# Patient Record
Sex: Male | Born: 1965 | Race: White | Hispanic: No | Marital: Married | Smoking: Never smoker
Health system: Southern US, Community
[De-identification: ages and names within clinical notes are randomized; demographics above are authoritative.]

## PROBLEM LIST (undated history)

## (undated) DIAGNOSIS — E119 Type 2 diabetes mellitus without complications: Secondary | ICD-10-CM

## (undated) HISTORY — PX: FOOT SURGERY: SHX648

---

## 1999-10-23 ENCOUNTER — Encounter: Payer: Self-pay | Admitting: Emergency Medicine

## 1999-10-23 ENCOUNTER — Emergency Department (HOSPITAL_COMMUNITY): Admission: EM | Admit: 1999-10-23 | Discharge: 1999-10-23 | Payer: Self-pay | Admitting: Emergency Medicine

## 1999-10-30 ENCOUNTER — Emergency Department (HOSPITAL_COMMUNITY): Admission: EM | Admit: 1999-10-30 | Discharge: 1999-10-30 | Payer: Self-pay | Admitting: Emergency Medicine

## 1999-12-02 ENCOUNTER — Encounter: Admission: RE | Admit: 1999-12-02 | Discharge: 2000-03-01 | Payer: Self-pay | Admitting: Internal Medicine

## 2018-12-13 ENCOUNTER — Emergency Department (HOSPITAL_BASED_OUTPATIENT_CLINIC_OR_DEPARTMENT_OTHER)
Admission: EM | Admit: 2018-12-13 | Discharge: 2018-12-13 | Disposition: A | Discharge status: Home / Self Care | Attending: Emergency Medicine | Admitting: Emergency Medicine

## 2018-12-13 ENCOUNTER — Encounter (HOSPITAL_BASED_OUTPATIENT_CLINIC_OR_DEPARTMENT_OTHER): Payer: Self-pay

## 2018-12-13 ENCOUNTER — Other Ambulatory Visit: Payer: Self-pay

## 2018-12-13 ENCOUNTER — Emergency Department (HOSPITAL_BASED_OUTPATIENT_CLINIC_OR_DEPARTMENT_OTHER)

## 2018-12-13 DIAGNOSIS — M549 Dorsalgia, unspecified: Secondary | ICD-10-CM

## 2018-12-13 DIAGNOSIS — Y9289 Other specified places as the place of occurrence of the external cause: Secondary | ICD-10-CM | POA: Diagnosis not present

## 2018-12-13 DIAGNOSIS — S299XXA Unspecified injury of thorax, initial encounter: Secondary | ICD-10-CM | POA: Diagnosis not present

## 2018-12-13 DIAGNOSIS — W01198A Fall on same level from slipping, tripping and stumbling with subsequent striking against other object, initial encounter: Secondary | ICD-10-CM | POA: Insufficient documentation

## 2018-12-13 DIAGNOSIS — S4992XA Unspecified injury of left shoulder and upper arm, initial encounter: Secondary | ICD-10-CM | POA: Insufficient documentation

## 2018-12-13 DIAGNOSIS — W19XXXA Unspecified fall, initial encounter: Secondary | ICD-10-CM

## 2018-12-13 DIAGNOSIS — Y9301 Activity, walking, marching and hiking: Secondary | ICD-10-CM | POA: Diagnosis not present

## 2018-12-13 DIAGNOSIS — Y998 Other external cause status: Secondary | ICD-10-CM | POA: Insufficient documentation

## 2018-12-13 DIAGNOSIS — M546 Pain in thoracic spine: Secondary | ICD-10-CM

## 2018-12-13 MED ORDER — ACETAMINOPHEN 500 MG PO TABS: 500.0000 mg | ORAL_TABLET | Freq: Four times a day (QID) | ORAL | 0 refills | Status: AC | PRN

## 2018-12-13 MED ORDER — NAPROXEN 500 MG PO TABS: 500.0000 mg | ORAL_TABLET | Freq: Two times a day (BID) | ORAL | 0 refills | Status: AC

## 2018-12-13 MED ORDER — KETOROLAC TROMETHAMINE 30 MG/ML IJ SOLN
30.0000 mg | Freq: Once | INTRAMUSCULAR | Status: AC
  Administered 2018-12-13: 30 mg via INTRAMUSCULAR
  Filled 2018-12-13: qty 1

## 2018-12-13 MED ORDER — METHOCARBAMOL 500 MG PO TABS: 500.0000 mg | ORAL_TABLET | Freq: Two times a day (BID) | ORAL | 0 refills | Status: AC

## 2018-12-13 NOTE — Discharge Instructions (Signed)
Take Naprosyn twice daily for your pain and inflammation.  Take Tylenol as prescribed throughout the day as needed.  Take Robaxin twice daily as needed for muscle pain and spasms.  Do not drive or operate machinery while taking this medication.  Use ice and heat alternating 20 minutes on, 20 minutes off.  Attempt the stretches below as tolerated daily.  Avoid heavy lifting, but try to keep up with your normal daily activity.  Do not sit or lay down too much as this can cause your muscles to tighten up more.  Please return to the emergency department you develop any new or worsening symptoms.

## 2018-12-13 NOTE — ED Provider Notes (Signed)
[facility] EMERGENCY DEPARTMENT Provider Note   CSN: [id number] Arrival date & time: 12/13/18  1616     History   Chief Complaint No chief complaint on file.   HPI Allen Ortega is a 52 y.o. male who is previously healthy presents with left shoulder and left-sided back pain after fall.  Patient reports he was working at the [employer] and carrying a casket when he tripped and fell.  He hit the left side of his upper back and low back on the CAT scan and his low back on the floor.  He did not hit his head or lose consciousness.  He essentially tripped.  He did not feel dizzy or lightheaded prior to the fall.  He denies any numbness or tingling in his legs, loss of bowel or bladder control.  He was ambulatory.  He did not take any medications prior to arrival.  HPI  History reviewed. No pertinent past medical history.  There are no active problems to display for this patient.   History reviewed. No pertinent surgical history.      Home Medications    Prior to Admission medications   Medication Sig Start Date End Date Taking? Authorizing Provider  acetaminophen (TYLENOL) 500 MG tablet Take 1 tablet (500 mg total) by mouth every 6 (six) hours as needed. 12/13/18   Jaspal Pultz, Waylan Boga, PA-C  methocarbamol (ROBAXIN) 500 MG tablet Take 1 tablet (500 mg total) by mouth 2 (two) times daily. 12/13/18   Angelo Caroll, Waylan Boga, PA-C  naproxen (NAPROSYN) 500 MG tablet Take 1 tablet (500 mg total) by mouth 2 (two) times daily. 12/13/18   Emi Holes, PA-C    Family History No family history on file.  Social History Social History   Tobacco Use  . Smoking status: Never Smoker  . Smokeless tobacco: Never Used  Substance Use Topics  . Alcohol use: Never    Frequency: Never  . Drug use: Never     Allergies   Patient has no known allergies.   Review of Systems Review of Systems  Musculoskeletal: Positive for arthralgias and back pain.  Neurological: Negative for  syncope and numbness.     Physical Exam Updated Vital Signs BP 119/84 (BP Location: Left Arm)   Pulse 86   Temp 98.4 F (36.9 C) (Oral)   Resp 20   Ht 6\' 2"  (1.88 m)   Wt (!) 136.7 kg   SpO2 98%   BMI 38.69 kg/m   Physical Exam Vitals signs and nursing note reviewed.  Constitutional:      General: He is not in acute distress.    Appearance: He is well-developed. He is not diaphoretic.  HENT:     Head: Normocephalic and atraumatic.     Mouth/Throat:     Pharynx: No oropharyngeal exudate.  Eyes:     General: No scleral icterus.       Right eye: No discharge.        Left eye: No discharge.     Conjunctiva/sclera: Conjunctivae normal.     Pupils: Pupils are equal, round, and reactive to light.  Neck:     Musculoskeletal: Normal range of motion and neck supple.     Thyroid: No thyromegaly.  Cardiovascular:     Rate and Rhythm: Normal rate and regular rhythm.     Heart sounds: Normal heart sounds. No murmur. No friction rub. No gallop.   Pulmonary:     Effort: Pulmonary effort is normal. No respiratory  distress.     Breath sounds: Normal breath sounds. No stridor. No wheezing or rales.  Abdominal:     General: Bowel sounds are normal. There is no distension.     Palpations: Abdomen is soft.     Tenderness: There is no abdominal tenderness. There is no guarding or rebound.  Musculoskeletal:     Comments: Tenderness over the left shoulder and scapula Tenderness on the left thoracic and lumbar region and in the left gluteal region No midline cervical, thoracic, or lumbar tenderness  No bony tenderness over the hips  Lymphadenopathy:     Cervical: No cervical adenopathy.  Skin:    General: Skin is warm and dry.     Coloration: Skin is not pale.     Findings: No rash.  Neurological:     Mental Status: He is alert.     Coordination: Coordination normal.     Comments: 5/5 strength and normal sensation to bilateral lower extremities      ED Treatments / Results    Labs (all labs ordered are listed, but only abnormal results are displayed) Labs Reviewed - No data to display  EKG None  Radiology Dg Thoracic Spine W/swimmers  Result Date: 12/13/2018 CLINICAL DATA:  Fall at work today.  Upper back pain. EXAM: THORACIC SPINE - 3 VIEWS COMPARISON:  None. FINDINGS: The alignment is normal. There is no evidence of acute fracture, paraspinal hematoma or widening of the interpedicular distance. There are flowing osteophytes throughout the thoracic spine. IMPRESSION: No acute osseous findings. Probable diffuse idiopathic skeletal hyperostosis. Electronically Signed   By: Carey BullocksWilliam  Veazey M.D.   On: 12/13/2018 17:58   Dg Lumbar Spine Complete  Result Date: 12/13/2018 CLINICAL DATA:  Left-sided low back pain after falling at work today. EXAM: LUMBAR SPINE - COMPLETE 4+ VIEW COMPARISON:  Abdominopelvic CT 01/24/2018. FINDINGS: Bone detail is limited by motion on the AP view. There are 5 lumbar type vertebral bodies. The alignment is normal. No evidence of acute fracture or pars defect. There is moderate lower lumbar spondylosis with paraspinal osteophytes and facet hypertrophy. The sacroiliac joints do not appear ankylosed. IMPRESSION: No acute osseous findings.  Moderate lumbar spondylosis. Electronically Signed   By: Carey BullocksWilliam  Veazey M.D.   On: 12/13/2018 18:00   Dg Shoulder Left  Result Date: 12/13/2018 CLINICAL DATA:  Fall at work today.  Left shoulder injury. EXAM: LEFT SHOULDER - 2+ VIEW COMPARISON:  None. FINDINGS: The mineralization and alignment are normal. There is no evidence of acute fracture or dislocation. The subacromial space is preserved. There are mild acromioclavicular degenerative changes. IMPRESSION: No evidence of acute fracture or dislocation. Electronically Signed   By: Carey BullocksWilliam  Veazey M.D.   On: 12/13/2018 17:56    Procedures Procedures (including critical care time)  Medications Ordered in ED Medications  ketorolac (TORADOL) 30 MG/ML  injection 30 mg (30 mg Intramuscular Given 12/13/18 1719)     Initial Impression / Assessment and Plan / ED Course  I have reviewed the triage vital signs and the nursing notes.  Pertinent labs & imaging results that were available during my care of the patient were reviewed by me and considered in my medical decision making (see chart for details).     Patient presenting with left shoulder and back pain after fall.  He is neurovascularly intact.  X-ray of the left shoulder, thoracic, and lumbar spine are negative for acute findings.  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  Patient made aware of these  chronic findings.  Patient is feeling much better after Toradol.  Will discharge home with ibuprofen, Tylenol, Robaxin.  Ice and heat recommended.  Follow-up to PCP as needed.  Return precautions discussed.  Patient understands and agrees with plan.  Patient vital stable throughout ED course and discharged in satisfactory condition.  Final Clinical Impressions(s) / ED Diagnoses   Final diagnoses:  Thoracic back pain  Fall, initial encounter  Acute left-sided back pain, unspecified back location    ED Discharge Orders         Ordered    naproxen (NAPROSYN) 500 MG tablet  2 times daily     12/13/18 1832    acetaminophen (TYLENOL) 500 MG tablet  Every 6 hours PRN     12/13/18 1832    methocarbamol (ROBAXIN) 500 MG tablet  2 times daily     12/13/18 [address]1832           Ardith Test M, Cordelia Poche-C 12/14/18 2012    Gwyneth SproutPlunkett, Whitney, MD 12/15/18 0100

## 2018-12-13 NOTE — ED Triage Notes (Signed)
Pt states he fell at work ~3pm-pain to upper back, neck and lower back-NAD-slow gait

## 2018-12-13 NOTE — ED Notes (Signed)
NAD at this time. Pt is stable and going home.  

## 2021-10-11 ENCOUNTER — Emergency Department (HOSPITAL_BASED_OUTPATIENT_CLINIC_OR_DEPARTMENT_OTHER)
Admission: EM | Admit: 2021-10-11 | Discharge: 2021-10-11 | Disposition: A | Discharge status: Home / Self Care | Payer: BLUE CROSS/BLUE SHIELD | Attending: Emergency Medicine | Admitting: Emergency Medicine

## 2021-10-11 ENCOUNTER — Encounter (HOSPITAL_BASED_OUTPATIENT_CLINIC_OR_DEPARTMENT_OTHER): Payer: Self-pay | Admitting: Urology

## 2021-10-11 ENCOUNTER — Emergency Department (HOSPITAL_BASED_OUTPATIENT_CLINIC_OR_DEPARTMENT_OTHER): Payer: BLUE CROSS/BLUE SHIELD

## 2021-10-11 DIAGNOSIS — M25562 Pain in left knee: Secondary | ICD-10-CM | POA: Insufficient documentation

## 2021-10-11 MED ORDER — HYDROCODONE-ACETAMINOPHEN 5-325 MG PO TABS
1.0000 | ORAL_TABLET | Freq: Once | ORAL | Status: AC
  Administered 2021-10-11: 1 via ORAL
  Filled 2021-10-11: qty 1

## 2021-10-11 MED ORDER — IBUPROFEN 600 MG PO TABS: 600.0000 mg | ORAL_TABLET | Freq: Four times a day (QID) | ORAL | 0 refills | Status: AC | PRN

## 2021-10-11 MED ORDER — HYDROCODONE-ACETAMINOPHEN 5-325 MG PO TABS: 1.0000 | ORAL_TABLET | ORAL | 0 refills | Status: AC | PRN

## 2021-10-11 NOTE — ED Triage Notes (Signed)
Pt states left knee pain x 2 days with bending, able to bear weight.  States fell x 8 weeks ago on concrete on the same knee.

## 2021-10-11 NOTE — ED Provider Notes (Signed)
[facility] EMERGENCY DEPARTMENT Provider Note   CSN: [id number] Arrival date & time: 10/11/21  1110     History Chief Complaint  Patient presents with   Knee Pain    Allen Ortega is a 55 y.o. male.  This is a 55 y.o. male without significant medical history who presents to the ED with complaint of left knee pain.   Location:  left knee Duration:  8 wks, worse in last 5 days Onset:  gradual Timing:  intermittent Description:  sharp/aching pain, more on medial aspect to knee Severity:  mild Exacerbating/Alleviating Factors:  worse with palpation, ambulation Associated Symptoms:  none reported Pertinent Negatives:  fevers, chills, IVDU, hip/back pain, numbness or tingling Context: fall around 8 wks ago onto the left knee. Intermittent pain since then, takes motrin periodically with improvement to pain. Pain worsened in last 4-5 days.    The history is provided by the patient. No language interpreter was used.  Knee Pain Location:  Knee Knee location:  L knee Pain details:    Quality:  Aching Associated symptoms: no fever       History reviewed. No pertinent past medical history.  There are no problems to display for this patient.   Past Surgical History:  Procedure Laterality Date   FOOT SURGERY Left        History reviewed. No pertinent family history.  Social History   Tobacco Use   Smoking status: Never   Smokeless tobacco: Never  Vaping Use   Vaping Use: Never used  Substance Use Topics   Alcohol use: Never   Drug use: Never    Home Medications Prior to Admission medications   Medication Sig Start Date End Date Taking? Authorizing Provider  HYDROcodone-acetaminophen (NORCO/VICODIN) 5-325 MG tablet Take 1 tablet by mouth every 4 (four) hours as needed. 10/11/21  Yes Tanda Rockers A, DO  ibuprofen (ADVIL) 600 MG tablet Take 1 tablet (600 mg total) by mouth every 6 (six) hours as needed. 10/11/21  Yes Sloan Leiter, DO  acetaminophen  (TYLENOL) 500 MG tablet Take 1 tablet (500 mg total) by mouth every 6 (six) hours as needed. 12/13/18   Law, Waylan Boga, PA-C  methocarbamol (ROBAXIN) 500 MG tablet Take 1 tablet (500 mg total) by mouth 2 (two) times daily. 12/13/18   Law, Waylan Boga, PA-C  naproxen (NAPROSYN) 500 MG tablet Take 1 tablet (500 mg total) by mouth 2 (two) times daily. 12/13/18   Emi Holes, PA-C    Allergies    Patient has no known allergies.  Review of Systems   Review of Systems  Constitutional:  Negative for chills and fever.  HENT:  Negative for facial swelling and trouble swallowing.   Eyes:  Negative for photophobia and visual disturbance.  Respiratory:  Negative for cough and shortness of breath.   Cardiovascular:  Negative for chest pain and palpitations.  Gastrointestinal:  Negative for abdominal pain, nausea and vomiting.  Endocrine: Negative for polydipsia and polyuria.  Genitourinary:  Negative for difficulty urinating and hematuria.  Musculoskeletal:  Positive for arthralgias. Negative for gait problem and joint swelling.  Skin:  Negative for pallor and rash.  Neurological:  Negative for syncope and headaches.  Psychiatric/Behavioral:  Negative for agitation and confusion.    Physical Exam Updated Vital Signs BP 136/81 (BP Location: Left Arm)   Pulse 79   Temp 98 F (36.7 C) (Oral)   Resp 18   Ht 6\' 2"  (1.88 m)   Wt )  136.7 kg   SpO2 98%   BMI 38.69 kg/m   Physical Exam Vitals and nursing note reviewed.  Constitutional:      General: He is not in acute distress.    Appearance: He is well-developed.  HENT:     Head: Normocephalic and atraumatic.     Right Ear: External ear normal.     Left Ear: External ear normal.     Mouth/Throat:     Mouth: Mucous membranes are moist.  Eyes:     General: No scleral icterus. Cardiovascular:     Rate and Rhythm: Normal rate and regular rhythm.     Pulses: Normal pulses.     Heart sounds: Normal heart sounds.  Pulmonary:      Effort: Pulmonary effort is normal. No respiratory distress.     Breath sounds: Normal breath sounds.  Abdominal:     General: Abdomen is flat.     Palpations: Abdomen is soft.     Tenderness: There is no abdominal tenderness.  Musculoskeletal:        General: Normal range of motion.     Cervical back: Normal range of motion.     Right lower leg: No edema.     Left lower leg: No edema.       Legs:     Comments: 2+ PT pulses b/l  Skin:    General: Skin is warm and dry.     Capillary Refill: Capillary refill takes less than 2 seconds.  Neurological:     Mental Status: He is alert and oriented to person, place, and time.  Psychiatric:        Mood and Affect: Mood normal.        Behavior: Behavior normal.    ED Results / Procedures / Treatments   Labs (all labs ordered are listed, but only abnormal results are displayed) Labs Reviewed - No data to display  EKG None  Radiology DG Knee Complete 4 Views Left  Result Date: 10/11/2021 CLINICAL DATA:  Fall 8 weeks ago, left knee pain for 2 days EXAM: LEFT KNEE - COMPLETE 4+ VIEW COMPARISON:  None. FINDINGS: There is no acute fracture or dislocation. Knee alignment is normal. There is mild medial and lateral tibiofemoral joint space narrowing. The soft tissues are unremarkable. There is no effusion. IMPRESSION: No acute fracture or dislocation.  Minimal degenerative changes. Electronically Signed   By: Lesia Hausen M.D.   On: 10/11/2021 12:32    Procedures Procedures   Medications Ordered in ED Medications  HYDROcodone-acetaminophen (NORCO/VICODIN) 5-325 MG per tablet 1 tablet (1 tablet Oral Given 10/11/21 1304)    ED Course  I have reviewed the triage vital signs and the nursing notes.  Pertinent labs & imaging results that were available during my care of the patient were reviewed by me and considered in my medical decision making (see chart for details).    MDM Rules/Calculators/A&P                          CC: left  knee pain  This patient complains of left knee pain; this involves an extensive number of treatment options and is a complaint that carries with it a high risk of complications and morbidity. Vital signs were reviewed. Serious etiologies considered.  Record review:   Previous records obtained and reviewed  Work up as above, notable for:  I ordered imaging studies which included left knee xr and I independently visualized and  interpreted imaging which showed no acute process  Management: Give ace wrap, norco  Reassessment:  Patient does report he is feeling better. At this time suspicion for MSK etiology, bursitis. Doubt septic joint. Patient is ambulatory. Discussed supportive care at home, RICE, analgesics, anti-inflammatory medications. Work note provided.    The patient improved significantly and was discharged in stable condition. Detailed discussions were had with the patient regarding current findings, and need for close f/u with PCP or on call doctor. The patient has been instructed to return immediately if the symptoms worsen in any way for re-evaluation. Patient verbalized understanding and is in agreement with current care plan. All questions answered prior to discharge.           This chart was dictated using voice recognition software.  Despite best efforts to proofread,  errors can occur which can change the documentation meaning.  Final Clinical Impression(s) / ED Diagnoses Final diagnoses:  Left knee pain, unspecified chronicity    Rx / DC Orders ED Discharge Orders          Ordered    HYDROcodone-acetaminophen (NORCO/VICODIN) 5-325 MG tablet  Every 4 hours PRN        10/11/21 1346    ibuprofen (ADVIL) 600 MG tablet  Every 6 hours PRN        10/11/21 1346             Sloan Leiter, DO 10/11/21 1346

## 2021-11-21 IMAGING — CR DG KNEE COMPLETE 4+V*L*
4 series · 4 of 4 positions shown · non-contrast
Comparison: None.

CLINICAL DATA: Fall 8 weeks ago, left knee pain for 2 days

EXAM:
LEFT KNEE - COMPLETE 4+ VIEW

[t knee oblique left * (1 of 2)]
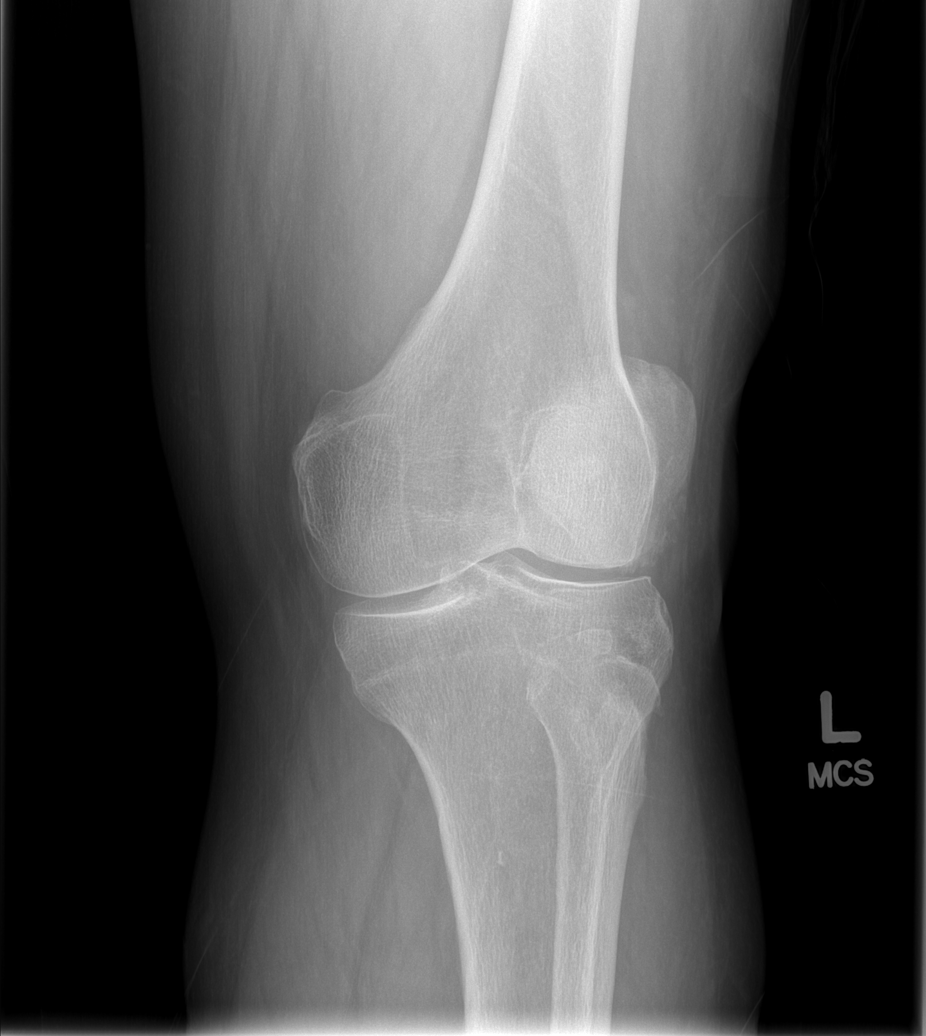

[t knee ap left *]
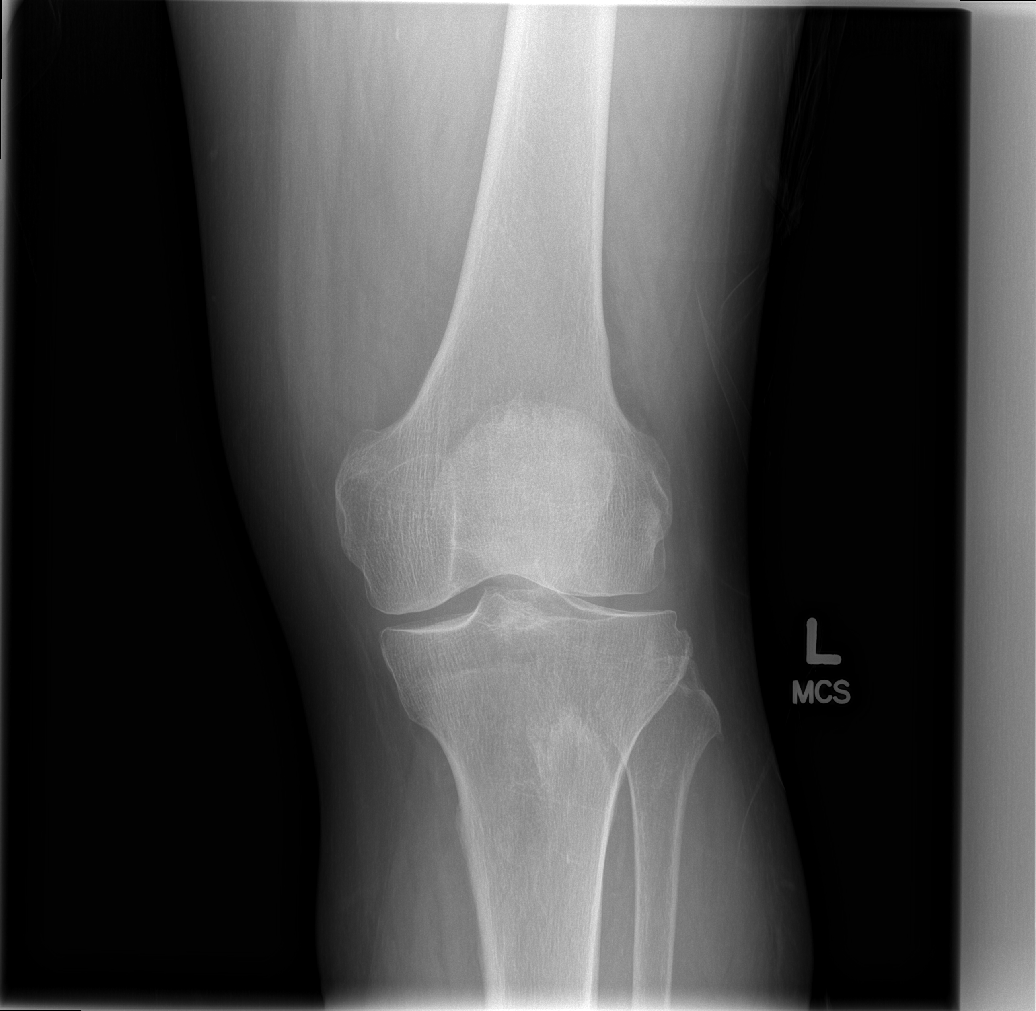

[t knee oblique left * (2 of 2)]
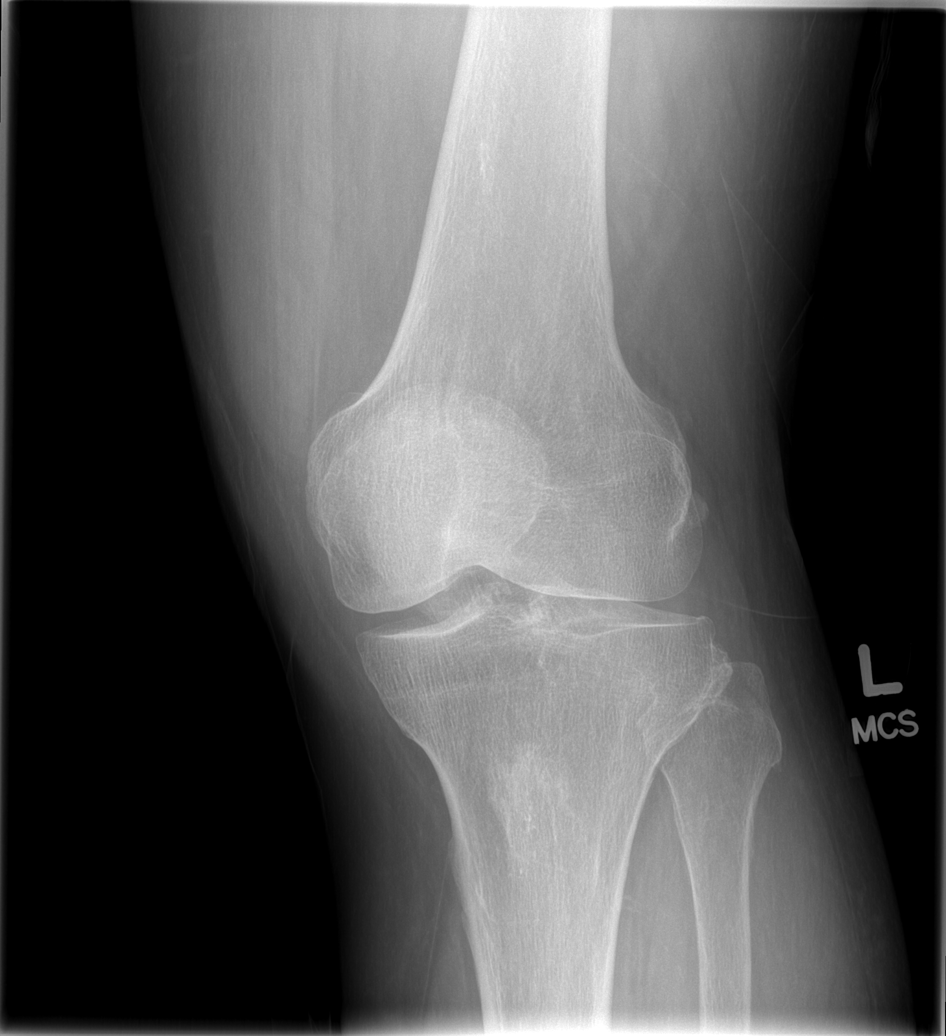

[t knee lat left *]
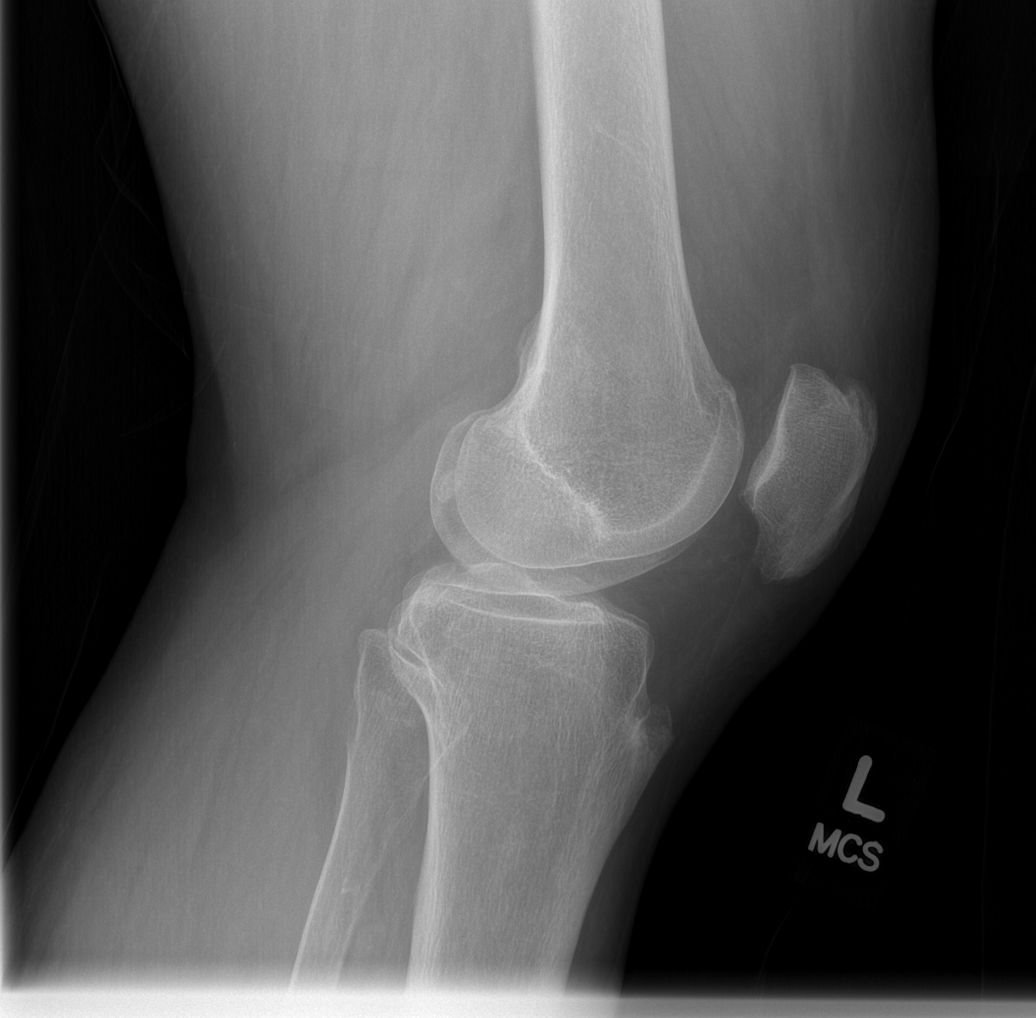

[4 of 4 positions shown; findings below may reference images not displayed]

FINDINGS: There is no acute fracture or dislocation. Knee alignment is normal.
There is mild medial and lateral tibiofemoral joint space narrowing.
The soft tissues are unremarkable. There is no effusion.
IMPRESSION: No acute fracture or dislocation.  Minimal degenerative changes.

## 2022-03-07 ENCOUNTER — Emergency Department (HOSPITAL_BASED_OUTPATIENT_CLINIC_OR_DEPARTMENT_OTHER)
Admission: EM | Admit: 2022-03-07 | Discharge: 2022-03-07 | Disposition: A | Discharge status: Home / Self Care | Payer: BLUE CROSS/BLUE SHIELD | Attending: Emergency Medicine | Admitting: Emergency Medicine

## 2022-03-07 ENCOUNTER — Encounter (HOSPITAL_BASED_OUTPATIENT_CLINIC_OR_DEPARTMENT_OTHER): Payer: Self-pay | Admitting: *Deleted

## 2022-03-07 ENCOUNTER — Other Ambulatory Visit: Payer: Self-pay

## 2022-03-07 DIAGNOSIS — M5442 Lumbago with sciatica, left side: Secondary | ICD-10-CM | POA: Diagnosis not present

## 2022-03-07 DIAGNOSIS — E119 Type 2 diabetes mellitus without complications: Secondary | ICD-10-CM | POA: Insufficient documentation

## 2022-03-07 DIAGNOSIS — Z7984 Long term (current) use of oral hypoglycemic drugs: Secondary | ICD-10-CM | POA: Insufficient documentation

## 2022-03-07 DIAGNOSIS — M545 Low back pain, unspecified: Secondary | ICD-10-CM | POA: Diagnosis present

## 2022-03-07 HISTORY — DX: Type 2 diabetes mellitus without complications: E11.9

## 2022-03-07 MED ORDER — LIDOCAINE 5 % EX PTCH
1.0000 | MEDICATED_PATCH | CUTANEOUS | Status: DC
  Administered 2022-03-07: 1 via TRANSDERMAL
  Filled 2022-03-07: qty 1

## 2022-03-07 MED ORDER — PREDNISONE 10 MG (21) PO TBPK: ORAL_TABLET | Freq: Every day | ORAL | 0 refills | Status: AC

## 2022-03-07 MED ORDER — METHOCARBAMOL 500 MG PO TABS
500.0000 mg | ORAL_TABLET | Freq: Once | ORAL | Status: AC
  Administered 2022-03-07: 500 mg via ORAL
  Filled 2022-03-07: qty 1

## 2022-03-07 MED ORDER — KETOROLAC TROMETHAMINE 60 MG/2ML IM SOLN
30.0000 mg | Freq: Once | INTRAMUSCULAR | Status: AC
  Administered 2022-03-07: 30 mg via INTRAMUSCULAR
  Filled 2022-03-07: qty 2

## 2022-03-07 MED ORDER — PREDNISONE 50 MG PO TABS
60.0000 mg | ORAL_TABLET | Freq: Once | ORAL | Status: AC
  Administered 2022-03-07: 60 mg via ORAL
  Filled 2022-03-07: qty 1

## 2022-03-07 MED ORDER — METHOCARBAMOL 500 MG PO TABS: 500.0000 mg | ORAL_TABLET | Freq: Two times a day (BID) | ORAL | 0 refills | Status: AC

## 2022-03-07 NOTE — ED Provider Notes (Signed)
?[facility] EMERGENCY DEPARTMENT ?Provider Note ? ? ?CSN: [id number] ?Arrival date & time: 03/07/22  1549 ? ?  ? ?History ? ?Chief Complaint  ?Patient presents with  ? Back Pain  ? ? ?Allen Ortega is a 56 y.o. male with PMHx Diabetes (most recent A1c 4.9%) who presents to the ED today with complaint of gradual onset, constant, worsening, achy, left lower back pain with radiation down LLE x 3 days.  Patient denies any recent trauma or falls.  He reports that he has been taking ibuprofen and Tylenol with minimal relief.  He reports pain is worse after prolonged sitting or laying down and feels muscle spasming in his back.  He reports that he had similar issues a couple of months ago however it went away after about a day with alternating heat and ice which she has been trying this time without success.  Patient denies any fevers, chills, saddle anesthesia, urinary retention, urinary or bowel incontinence, weakness/numbness, any other associated symptoms.  No history of IV drug abuse.  No history of recent spinal manipulation.  No recent prolonged steroid use.  ? ?The history is provided by the patient and medical records.  ? ?  ? ?Home Medications ?Prior to Admission medications   ?Medication Sig Start Date End Date Taking? Authorizing Provider  ?metFORMIN (GLUCOPHAGE) 1000 MG tablet Take 1 tablet by mouth 2 (two) times daily with a meal. 01/27/22  Yes [provider]  ?methocarbamol (ROBAXIN) 500 MG tablet Take 1 tablet (500 mg total) by mouth 2 (two) times daily. 03/07/22  Yes Falecia Vannatter, PA-C  ?predniSONE (STERAPRED UNI-PAK 21 TAB) 10 MG (21) TBPK tablet Take by mouth daily. Follow package insert 03/07/22  Yes Hyman HopesVenter, Prospero Mahnke, PA-C  ?acetaminophen (TYLENOL) 500 MG tablet Take 1 tablet (500 mg total) by mouth every 6 (six) hours as needed. 12/13/18   Emi HolesLaw, Alexandra M, PA-C  ?HYDROcodone-acetaminophen (NORCO/VICODIN) 5-325 MG tablet Take 1 tablet by mouth every 4 (four) hours as needed. 10/11/21    Sloan LeiterGray, Samuel A, DO  ?ibuprofen (ADVIL) 600 MG tablet Take 1 tablet (600 mg total) by mouth every 6 (six) hours as needed. 10/11/21   Sloan LeiterGray, Samuel A, DO  ?methocarbamol (ROBAXIN) 500 MG tablet Take 1 tablet (500 mg total) by mouth 2 (two) times daily. 12/13/18   Emi HolesLaw, Alexandra M, PA-C  ?naproxen (NAPROSYN) 500 MG tablet Take 1 tablet (500 mg total) by mouth 2 (two) times daily. 12/13/18   Emi HolesLaw, Alexandra M, PA-C  ?   ? ?Allergies    ?Patient has no known allergies.   ? ?Review of Systems   ?Review of Systems  ?Constitutional:  Negative for chills and fever.  ?Genitourinary:  Negative for difficulty urinating, dysuria, flank pain and frequency.  ?Musculoskeletal:  Positive for back pain.  ?Neurological:  Negative for weakness and numbness.  ?All other systems reviewed and are negative. ? ?Physical Exam ?Updated Vital Signs ?BP 134/76 (BP Location: Right Arm)   Pulse 86   Temp 98 ?F (36.7 ?C) (Oral)   Resp 20   Ht 6\' 2"  (1.88 m)   Wt 136.1 kg   SpO2 96%   BMI 38.52 kg/m?  ?Physical Exam ?Vitals and nursing note reviewed.  ?Constitutional:   ?   Appearance: He is obese. He is not ill-appearing or diaphoretic.  ?HENT:  ?   Head: Normocephalic and atraumatic.  ?Eyes:  ?   Conjunctiva/sclera: Conjunctivae normal.  ?Cardiovascular:  ?   Rate and Rhythm: Normal rate and  regular rhythm.  ?Pulmonary:  ?   Effort: Pulmonary effort is normal.  ?   Breath sounds: Normal breath sounds.  ?Musculoskeletal:  ?   Comments: No C, T, or L midline spinal TTP. + left lumbar paraspinal musculature TTP with noted spasming. ROM limited to back s/2 pain. Strength 4/5 to entire LLE s/2 pain. Sensation intact throughout. 2+ patellar reflexes bilaterally. 2+ PT pulses.   ?Skin: ?   General: Skin is warm and dry.  ?   Coloration: Skin is not jaundiced.  ?Neurological:  ?   Mental Status: He is alert.  ? ? ?ED Results / Procedures / Treatments   ?Labs ?(all labs ordered are listed, but only abnormal results are displayed) ?Labs Reviewed - No  data to display ? ?EKG ?None ? ?Radiology ?No results found. ? ?Procedures ?Procedures  ? ? ?Medications Ordered in ED ?Medications  ?lidocaine (LIDODERM) 5 % 1 patch (1 patch Transdermal Patch Applied 03/07/22 1623)  ?methocarbamol (ROBAXIN) tablet 500 mg (has no administration in time range)  ?predniSONE (DELTASONE) tablet 60 mg (60 mg Oral Given 03/07/22 1625)  ?ketorolac (TORADOL) injection 30 mg (30 mg Intramuscular Given 03/07/22 1623)  ? ? ?ED Course/ Medical Decision Making/ A&P ?  ?                        ?Medical Decision Making ?56 year old male who presents to the ED today with complaint of atraumatic left lower back pain with radiation down left lower extremity x3 days.  On arrival to the ED today patient is afebrile, nontachycardic and nontachypneic.  Personally visualized patient ambulating from the waiting room with antalgic gait.  On my exam he has no midline spinal tenderness palpation, step-offs, deformities.  He is noted to have tenderness palpation along the left lumbar paraspinal musculature with limited range of motion of his back and left lower extremity secondary to pain.  Strength and sensation intact throughout.  Vascularly intact.  Given no recent trauma I do not feel patient requires x-rays at this time.  Suspect sciatica today.  No red flag symptoms concerning for cauda equina, AAA, or spinal epidural abscess. We will treat in the ED with lidocaine patch, Toradol injection, prednisone.  Patient with history of diabetes however most recent A1c 4.9%.  He did drive himself to the ED today and therefore we will hold off on Robaxin or anything stronger for pain.  We will plan to reassess prior to discharge. ? ?On reevaluation pt reports improvement in symptoms. Significant other at bedside currently - will drive pt home. Provided with dose of Robaxin in the ED today. Will plan to discharge home with prednisone and robaxin with PCP follow up. Pt in agreement with plan and stable for discharge.   ? ?Problems Addressed: ?Acute left-sided low back pain with left-sided sciatica: acute illness or injury ? ?Risk ?Prescription drug management. ? ? ? ? ? ? ? ? ? ?Final Clinical Impression(s) / ED Diagnoses ?Final diagnoses:  ?Acute left-sided low back pain with left-sided sciatica  ? ? ?Rx / DC Orders ?ED Discharge Orders   ? ?      Ordered  ?  methocarbamol (ROBAXIN) 500 MG tablet  2 times daily       ? 03/07/22 1712  ?  predniSONE (STERAPRED UNI-PAK 21 TAB) 10 MG (21) TBPK tablet  Daily       ? 03/07/22 1712  ? ?  ?  ? ?  ? ? ? ?  Discharge Instructions   ? ?  ?Please pick up medications and take as prescribed. DO NOT DRIVE OR DRINK ALCOHOL WHILE ON THE MUSCLE RELAXER AS IT CAN MAKE YOU DROWSY.  ? ?You can take Tylenol as needed for pain. Avoid NSAIDs while on the steroids.  ? ?You can also buy OTC Salon Pas patches to apply to your back to help with discomfort.  ? ?Follow up with your PCP for further eval ? ?Return to the ED for any new/worsening symptoms including worsening pain, difficulty urinating, peeing or pooping on yourself, inability to ambulate, fevers, or any other new/concerning symptoms.  ? ? ? ? ?  ?Tanda Rockers, PA-C ?03/07/22 1713 ? ?  ?Franne Forts, DO ?03/07/22 2325 ? ?

## 2022-03-07 NOTE — ED Triage Notes (Signed)
Left flank pain with radiation into his left leg x 3 days ago. Pt is ambulatory.  ?

## 2022-03-07 NOTE — Discharge Instructions (Signed)
Please pick up medications and take as prescribed. DO NOT DRIVE OR DRINK ALCOHOL WHILE ON THE MUSCLE RELAXER AS IT CAN MAKE YOU DROWSY.  ? ?You can take Tylenol as needed for pain. Avoid NSAIDs while on the steroids.  ? ?You can also buy OTC Salon Pas patches to apply to your back to help with discomfort.  ? ?Follow up with your PCP for further eval ? ?Return to the ED for any new/worsening symptoms including worsening pain, difficulty urinating, peeing or pooping on yourself, inability to ambulate, fevers, or any other new/concerning symptoms.  ?

## 2022-03-07 NOTE — ED Notes (Signed)
Discharge instructions discussed with pt. Pt verbalized understanding. Pt stable and ambulatory.  °
